# Patient Record
Sex: Male | Born: 1980 | Race: White | Hispanic: No | Marital: Married | Smoking: Current every day smoker
Health system: Southern US, Community
[De-identification: ages and names within clinical notes are randomized; demographics above are authoritative.]

---

## 2004-10-31 ENCOUNTER — Emergency Department: Payer: Self-pay | Admitting: Emergency Medicine

## 2005-07-04 ENCOUNTER — Emergency Department: Payer: Self-pay | Admitting: Emergency Medicine

## 2005-07-07 ENCOUNTER — Emergency Department: Payer: Self-pay | Admitting: Emergency Medicine

## 2006-03-25 ENCOUNTER — Emergency Department: Payer: Self-pay | Admitting: Emergency Medicine

## 2006-06-09 ENCOUNTER — Emergency Department: Payer: Self-pay | Admitting: Emergency Medicine

## 2010-03-31 ENCOUNTER — Emergency Department: Payer: Self-pay | Admitting: Emergency Medicine

## 2010-04-25 ENCOUNTER — Emergency Department: Payer: Self-pay | Admitting: Emergency Medicine

## 2010-07-04 ENCOUNTER — Emergency Department: Payer: Self-pay | Admitting: Emergency Medicine

## 2010-07-23 ENCOUNTER — Emergency Department: Payer: Self-pay | Admitting: Emergency Medicine

## 2011-04-03 ENCOUNTER — Emergency Department: Payer: Self-pay | Admitting: Emergency Medicine

## 2012-05-08 ENCOUNTER — Emergency Department: Payer: Self-pay | Admitting: Emergency Medicine

## 2012-08-25 ENCOUNTER — Emergency Department: Payer: Self-pay | Admitting: Emergency Medicine

## 2013-08-23 ENCOUNTER — Emergency Department: Payer: Self-pay | Admitting: Emergency Medicine

## 2013-08-23 LAB — COMPREHENSIVE METABOLIC PANEL
Albumin: 3.8 g/dL (ref 3.4–5.0)
Alkaline Phosphatase: 82 U/L
Anion Gap: 2 — ABNORMAL LOW (ref 7–16)
BUN: 7 mg/dL (ref 7–18)
Creatinine: 0.9 mg/dL (ref 0.60–1.30)
EGFR (Non-African Amer.): >60
Potassium: 3.9 mmol/L (ref 3.5–5.1)
SGPT (ALT): 22 U/L (ref 12–78)
Sodium: 139 mmol/L (ref 136–145)
Total Protein: 6.7 g/dL (ref 6.4–8.2)

## 2013-08-23 LAB — CBC WITH DIFFERENTIAL/PLATELET
Basophil #: 0.1 10*3/uL (ref 0.0–0.1)
Basophil %: 0.7 %
Eosinophil #: 0.1 10*3/uL (ref 0.0–0.7)
HCT: 38.9 % — ABNORMAL LOW (ref 40.0–52.0)
HGB: 13.2 g/dL (ref 13.0–18.0)
MCH: 31.8 pg (ref 26.0–34.0)
MCHC: 34 g/dL (ref 32.0–36.0)
Monocyte #: 0.5 x10 3/mm (ref 0.2–1.0)
Neutrophil #: 5.5 10*3/uL (ref 1.4–6.5)
Neutrophil %: 73.5 %
RDW: 12.9 % (ref 11.5–14.5)
WBC: 7.5 10*3/uL (ref 3.8–10.6)

## 2013-08-23 LAB — URINALYSIS, COMPLETE
Blood: NEGATIVE
Glucose,UR: NEGATIVE mg/dL (ref 0–75)
Ketone: NEGATIVE
Leukocyte Esterase: NEGATIVE
Nitrite: NEGATIVE
Ph: 8 (ref 4.5–8.0)
Protein: NEGATIVE
Specific Gravity: 1.006 (ref 1.003–1.030)
Squamous Epithelial: NONE SEEN

## 2013-08-23 LAB — LIPASE, BLOOD: Lipase: 76 U/L (ref 73–393)

## 2014-06-18 ENCOUNTER — Emergency Department: Payer: Self-pay | Admitting: Emergency Medicine

## 2014-06-18 LAB — BASIC METABOLIC PANEL
Anion Gap: 5 — ABNORMAL LOW (ref 7–16)
BUN: 10 mg/dL (ref 7–18)
CALCIUM: 8 mg/dL — AB (ref 8.5–10.1)
CO2: 29 mmol/L (ref 21–32)
Chloride: 106 mmol/L (ref 98–107)
Creatinine: 0.84 mg/dL (ref 0.60–1.30)
EGFR (African American): >60
EGFR (Non-African Amer.): >60
Glucose: 124 mg/dL — ABNORMAL HIGH (ref 65–99)
Osmolality: 280 (ref 275–301)
Potassium: 3.7 mmol/L (ref 3.5–5.1)
Sodium: 140 mmol/L (ref 136–145)

## 2014-06-18 LAB — CBC WITH DIFFERENTIAL/PLATELET
BASOS PCT: 0.6 %
Basophil #: 0.1 10*3/uL (ref 0.0–0.1)
EOS PCT: 1.4 %
Eosinophil #: 0.2 10*3/uL (ref 0.0–0.7)
HCT: 36.9 % — AB (ref 40.0–52.0)
HGB: 12.2 g/dL — AB (ref 13.0–18.0)
Lymphocyte #: 1.4 10*3/uL (ref 1.0–3.6)
Lymphocyte %: 10.9 %
MCH: 30.8 pg (ref 26.0–34.0)
MCHC: 33 g/dL (ref 32.0–36.0)
MCV: 94 fL (ref 80–100)
Monocyte #: 0.9 x10 3/mm (ref 0.2–1.0)
Monocyte %: 7.3 %
NEUTROS PCT: 79.8 %
Neutrophil #: 10.4 10*3/uL — ABNORMAL HIGH (ref 1.4–6.5)
Platelet: 201 10*3/uL (ref 150–440)
RBC: 3.95 10*6/uL — ABNORMAL LOW (ref 4.40–5.90)
RDW: 12.3 % (ref 11.5–14.5)
WBC: 13.1 10*3/uL — ABNORMAL HIGH (ref 3.8–10.6)

## 2014-06-18 LAB — CK: CK, Total: 83 U/L

## 2014-06-18 LAB — PROTIME-INR
INR: 1
Prothrombin Time: 13 secs (ref 11.5–14.7)

## 2014-06-18 LAB — APTT: ACTIVATED PTT: 62.7 s — AB (ref 23.6–35.9)

## 2014-06-20 ENCOUNTER — Emergency Department: Payer: Self-pay | Admitting: Emergency Medicine

## 2014-06-20 LAB — COMPREHENSIVE METABOLIC PANEL
ALT: 24 U/L
Albumin: 3.5 g/dL (ref 3.4–5.0)
Alkaline Phosphatase: 80 U/L
Anion Gap: 6 — ABNORMAL LOW (ref 7–16)
BUN: 13 mg/dL (ref 7–18)
Bilirubin,Total: 0.7 mg/dL (ref 0.2–1.0)
CHLORIDE: 102 mmol/L (ref 98–107)
Calcium, Total: 8.7 mg/dL (ref 8.5–10.1)
Co2: 30 mmol/L (ref 21–32)
Creatinine: 0.87 mg/dL (ref 0.60–1.30)
EGFR (Non-African Amer.): >60
Glucose: 93 mg/dL (ref 65–99)
OSMOLALITY: 275 (ref 275–301)
Potassium: 3.8 mmol/L (ref 3.5–5.1)
SGOT(AST): 13 U/L — ABNORMAL LOW (ref 15–37)
Sodium: 138 mmol/L (ref 136–145)
Total Protein: 7.7 g/dL (ref 6.4–8.2)

## 2014-06-20 LAB — APTT: Activated PTT: 63.7 secs — ABNORMAL HIGH (ref 23.6–35.9)

## 2014-08-10 IMAGING — CR CERVICAL SPINE - COMPLETE 4+ VIEW
1 series · 6 of 6 positions shown · non-contrast
Comparison: none

REASON FOR EXAM: neck pain hx of old injury
COMMENTS:

PROCEDURE:     DXR - DXR CERVICAL SPINE COMPLETE  - August 25, 2012 [DATE]
RESULT:     Comparison: None.

[Series 1: lat · 0.17mm/px · 6 of 6 slices shown]
[im 1/6]
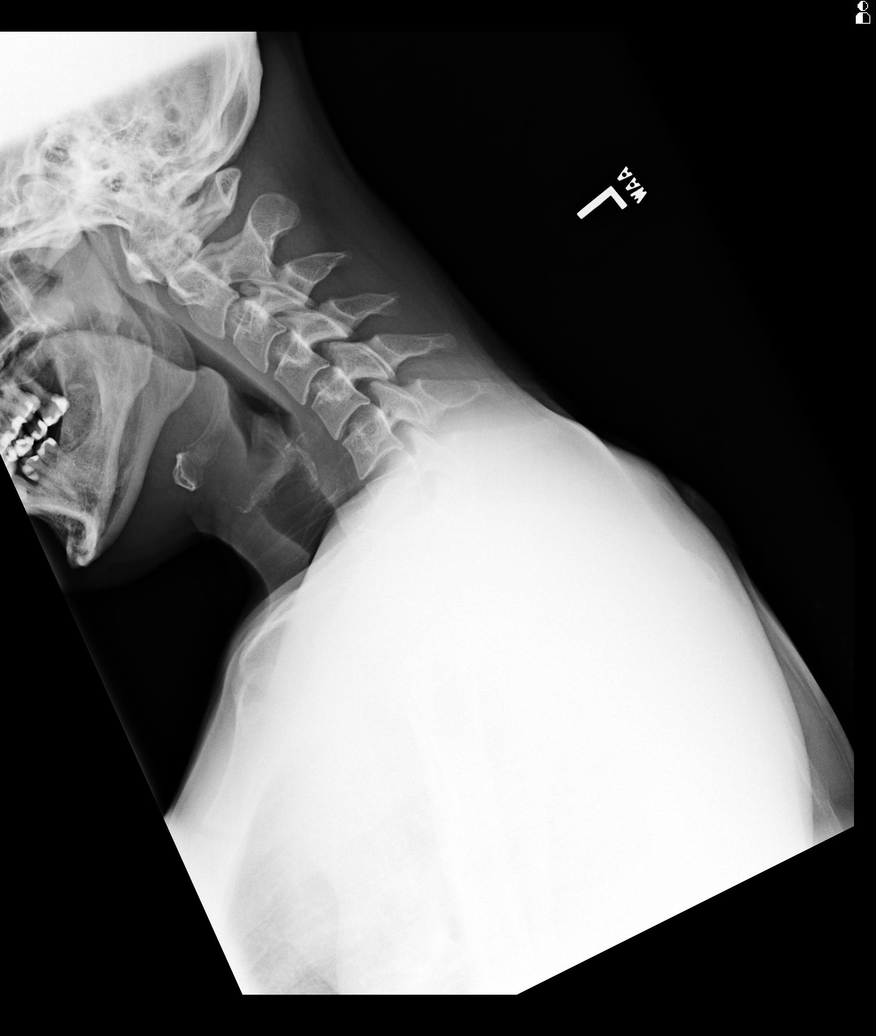
[im 2/6]
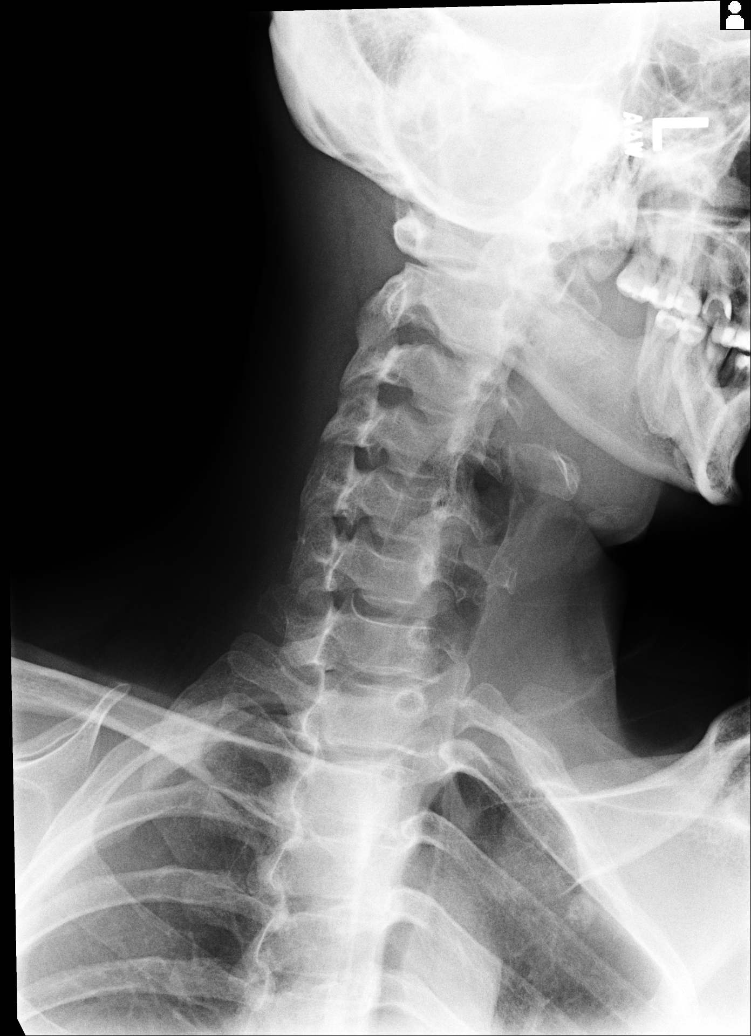
[im 3/6]
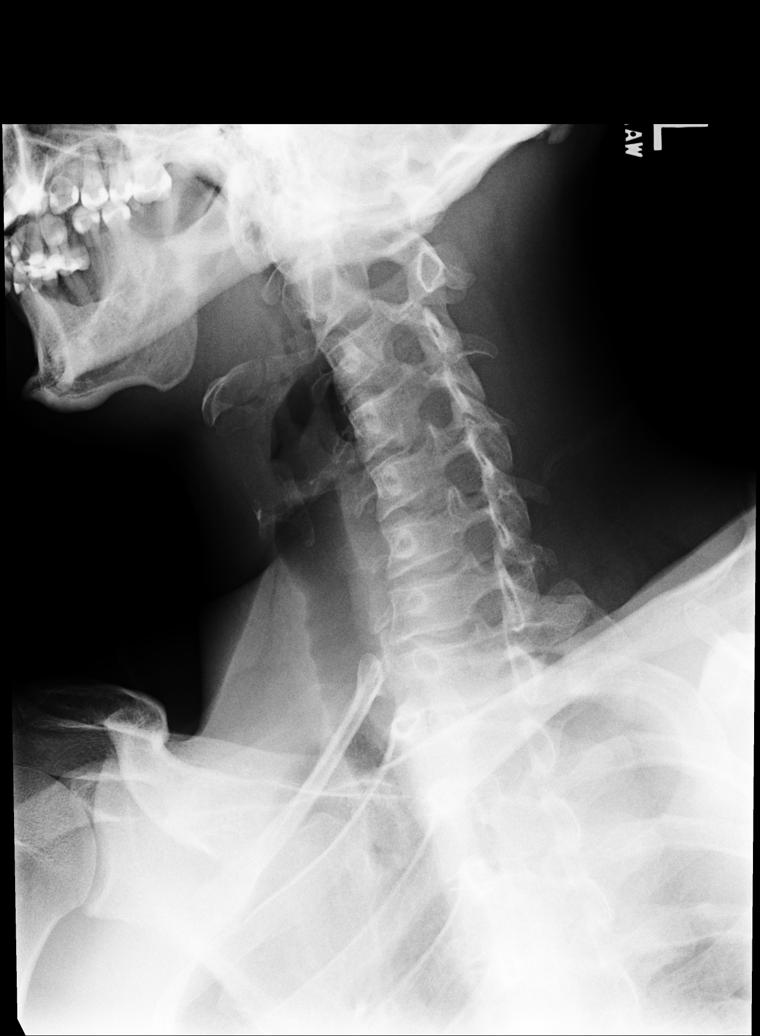
[im 4/6]
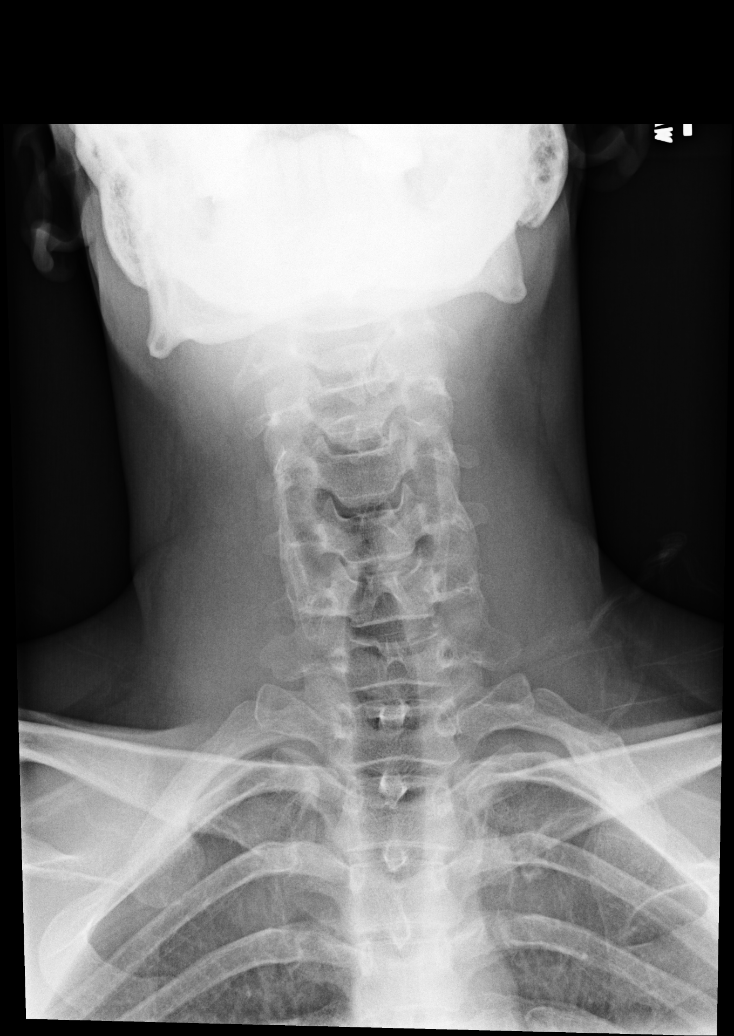
[im 5/6]
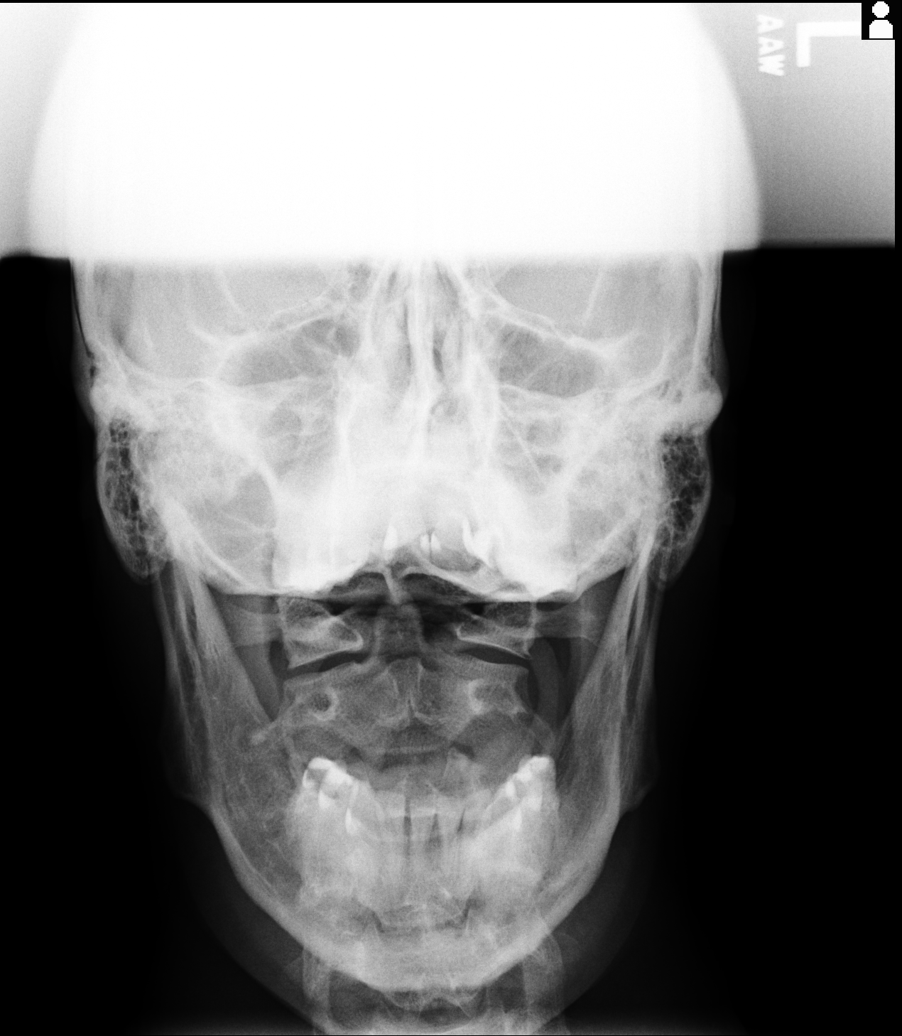
[im 6/6]
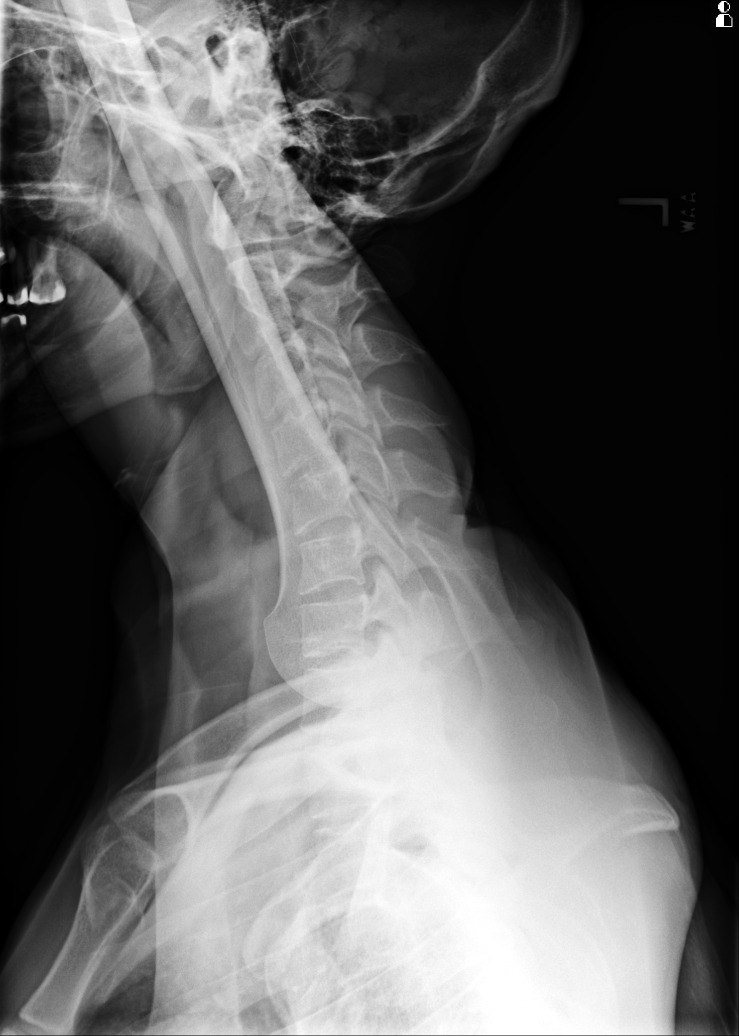

[6 of 6 positions shown; findings below may reference images not displayed]

FINDINGS: The the cervical spine is imaged from the skull base through C7. There is
reversal of the normal cervical lordosis, which is nonspecific. Vertebral
body heights and intervertebral disc heights are relatively preserved. There
is normal alignment. The atlantodental interval is at the upper limits of
normal. The prevertebral soft tissues are within normal limits. Evaluation
of the right neuroforamina is slightly limited secondary to patient
position. The left neuroforamina are patent.
IMPRESSION: Reversal of the normal cervical lordosis, which is nonspecific. Otherwise,
no acute findings the cervical spine.

If there is continued clinical concern for occult cervical spine fracture,
further evaluation with CT would be recommended.

[REDACTED]
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
## 2016-11-14 ENCOUNTER — Emergency Department: Payer: Medicaid Other

## 2016-11-14 ENCOUNTER — Encounter: Payer: Self-pay | Admitting: Emergency Medicine

## 2016-11-14 ENCOUNTER — Emergency Department
Admission: EM | Admit: 2016-11-14 | Discharge: 2016-11-14 | Disposition: A | Discharge status: Home / Self Care | Payer: Medicaid Other | Attending: Emergency Medicine | Admitting: Emergency Medicine

## 2016-11-14 DIAGNOSIS — R05 Cough: Secondary | ICD-10-CM | POA: Diagnosis present

## 2016-11-14 DIAGNOSIS — J189 Pneumonia, unspecified organism: Secondary | ICD-10-CM

## 2016-11-14 DIAGNOSIS — F17200 Nicotine dependence, unspecified, uncomplicated: Secondary | ICD-10-CM | POA: Insufficient documentation

## 2016-11-14 MED ORDER — AZITHROMYCIN 250 MG PO TABS: ORAL_TABLET | ORAL | 0 refills | Status: AC

## 2016-11-14 MED ORDER — PREDNISONE 50 MG PO TABS: 50.0000 mg | ORAL_TABLET | Freq: Every day | ORAL | 0 refills | Status: DC

## 2016-11-14 MED ORDER — BENZONATATE 200 MG PO CAPS
200.0000 mg | ORAL_CAPSULE | Freq: Three times a day (TID) | ORAL | 0 refills | Status: AC | PRN
Start: 2016-11-14 — End: ?

## 2016-11-14 NOTE — ED Provider Notes (Signed)
[facility] Emergency Department Provider Note  ____________________________________________  Time seen: Approximately 9:08 PM  I have reviewed the triage vital signs and the nursing notes.   HISTORY  Chief Complaint Cough and Generalized Body Aches    HPI Nathaniel Williamson is a 36 y.o. male who presents to emergency department complaining of 4-5 week history of cough, occasional chills, occasional body aches. The patient reports that he has not felt "bad" but just had a lingering cough over the past several weeks. Patient reports that symptoms began like a common cold and the cough has lingered. He denies any night sweats, fevers, difficulty breathing, productive cough, abdominal pain, nausea or vomiting. No medications prior to arrival. Patient is a smoker. No other complaints at this time.   History reviewed. No pertinent past medical history.  There are no active problems to display for this patient.   History reviewed. No pertinent surgical history.  Prior to Admission medications   Medication Sig Start Date End Date Taking? Authorizing Provider  azithromycin (ZITHROMAX Z-PAK) 250 MG tablet Take 2 tablets (500 mg) on  Day 1,  followed by 1 tablet (250 mg) once daily on Days 2 through 5. 11/14/16   Christiane Ha D Zanaya Baize, PA-C  benzonatate (TESSALON) 200 MG capsule Take 1 capsule (200 mg total) by mouth 3 (three) times daily as needed for cough. 11/14/16   Delorise Royals Shraga Custard, PA-C  predniSONE (DELTASONE) 50 MG tablet Take 1 tablet (50 mg total) by mouth daily with breakfast. 11/14/16   Delorise Royals Channie Bostick, PA-C    Allergies Patient has no known allergies.  History reviewed. No pertinent family history.  Social History Social History  Substance Use Topics  . Smoking status: Current Every Day Smoker  . Smokeless tobacco: Current User  . Alcohol use No     Review of Systems  Constitutional: No fever/chills Eyes: No visual changes. No  discharge ENT: No upper respiratory complaints. Cardiovascular: no chest pain. Respiratory: Positive cough. No SOB. Gastrointestinal: No abdominal pain.  No nausea, no vomiting.  No diarrhea.  No constipation. Musculoskeletal: Negative for musculoskeletal pain. Skin: Negative for rash, abrasions, lacerations, ecchymosis. Neurological: Negative for headaches, focal weakness or numbness. 10-point ROS otherwise negative.  ____________________________________________   PHYSICAL EXAM:  VITAL SIGNS: ED Triage Vitals  Enc Vitals Group     BP 11/14/16 2002 115/69     Pulse Rate 11/14/16 2002 79     Resp 11/14/16 2002 18     Temp 11/14/16 2002 98.4 F (36.9 C)     Temp Source 11/14/16 2002 Oral     SpO2 11/14/16 2002 97 %     Weight 11/14/16 2003 185 lb (83.9 kg)     Height 11/14/16 2003 6' (1.829 m)     Head Circumference --      Peak Flow --      Pain Score 11/14/16 2003 6     Pain Loc --      Pain Edu? --      Excl. in GC? --      Constitutional: Alert and oriented. Well appearing and in no acute distress. Eyes: Conjunctivae are normal. PERRL. EOMI. Head: Atraumatic. ENT:      Ears: EACs and TMs are unremarkable bilaterally      Nose: No congestion/rhinnorhea.      Mouth/Throat: Mucous membranes are moist.  Neck: No stridor.   Hematological/Lymphatic/Immunilogical: No cervical lymphadenopathy. Cardiovascular: Normal rate, regular rhythm. Normal S1 and S2.  Good peripheral circulation. Respiratory:  Normal respiratory effort without tachypnea or retractions. Lungs with scattered coarse breath sounds. No definitive rales, rhonchi, wheezing.Peri [place]. Good air entry to the bases with no decreased or absent breath sounds. Musculoskeletal: Full range of motion to all extremities. No gross deformities appreciated. Neurologic:  Normal speech and language. No gross focal neurologic deficits are appreciated.  Skin:  Skin is warm, dry and intact. No rash noted. Psychiatric: Mood and affect  are normal. Speech and behavior are normal. Patient exhibits appropriate insight and judgement.   ____________________________________________   LABS (all labs ordered are listed, but only abnormal results are displayed)  Labs Reviewed - No data to display ____________________________________________  EKG   ____________________________________________  RADIOLOGY Festus BarrenI, Coreen Shippee D Navneet Schmuck, personally viewed and evaluated these images (plain radiographs) as part of my medical decision making, as well as reviewing the written report by the radiologist.  Dg Chest 2 View  Result Date: 11/14/2016 CLINICAL DATA:  36 year old male with cough. EXAM: CHEST  2 VIEW COMPARISON:  None. FINDINGS: The heart size and mediastinal contours are within normal limits. Both lungs are clear. The visualized skeletal structures are unremarkable. IMPRESSION: No active cardiopulmonary disease. Electronically Signed   By: Elgie CollardArash  Radparvar M.D.   On: 11/14/2016 21:01    ____________________________________________    PROCEDURES  Procedure(s) performed:    Procedures    Medications - No data to display   ____________________________________________   INITIAL IMPRESSION / ASSESSMENT AND PLAN / ED COURSE  Pertinent labs & imaging results that were available during my care of the patient were reviewed by me and considered in my medical decision making (see chart for details).  Review of the [place] CSRS was performed in accordance of the [facility] prior to dispensing any controlled drugs.     Patient's diagnosis is consistent with community acquired pneumonia. Patient has had coughing 4-5 weeks. Patient's symptoms are consistent with walking pneumonia. X-ray reveals no acute consolidation consistent with lobar pneumonia.. Patient will be discharged home with prescriptions for steroids and antibiotic. Patient is to follow up with private care as needed or otherwise directed. Patient is given ED precautions to  return to the ED for any worsening or new symptoms.     ____________________________________________  FINAL CLINICAL IMPRESSION(S) / ED DIAGNOSES  Final diagnoses:  Walking pneumonia      NEW MEDICATIONS STARTED DURING THIS VISIT:  New Prescriptions   AZITHROMYCIN (ZITHROMAX Z-PAK) 250 MG TABLET    Take 2 tablets (500 mg) on  Day 1,  followed by 1 tablet (250 mg) once daily on Days 2 through 5.   BENZONATATE (TESSALON) 200 MG CAPSULE    Take 1 capsule (200 mg total) by mouth 3 (three) times daily as needed for cough.   PREDNISONE (DELTASONE) 50 MG TABLET    Take 1 tablet (50 mg total) by mouth daily with breakfast.        This chart was dictated using voice recognition software/Dragon. Despite best efforts to proofread, errors can occur which can change the meaning. Any change was purely unintentional.    Racheal PatchesJonathan D Smriti Barkow, PA-C 11/14/16 2141    Jene Everyobert Kinner, MD 11/14/16 2311

## 2016-11-14 NOTE — ED Triage Notes (Signed)
Pt ambulatory to triage in NAD, reports cold sx including cough, chills, body aches x few days.

## 2017-06-25 ENCOUNTER — Emergency Department
Admission: EM | Admit: 2017-06-25 | Discharge: 2017-06-25 | Disposition: A | Discharge status: Home / Self Care | Payer: Medicaid Other | Attending: Emergency Medicine | Admitting: Emergency Medicine

## 2017-06-25 ENCOUNTER — Encounter: Payer: Self-pay | Admitting: *Deleted

## 2017-06-25 DIAGNOSIS — M25511 Pain in right shoulder: Secondary | ICD-10-CM | POA: Insufficient documentation

## 2017-06-25 DIAGNOSIS — F17200 Nicotine dependence, unspecified, uncomplicated: Secondary | ICD-10-CM | POA: Diagnosis not present

## 2017-06-25 DIAGNOSIS — F17220 Nicotine dependence, chewing tobacco, uncomplicated: Secondary | ICD-10-CM | POA: Insufficient documentation

## 2017-06-25 DIAGNOSIS — M7541 Impingement syndrome of right shoulder: Secondary | ICD-10-CM | POA: Diagnosis not present

## 2017-06-25 MED ORDER — DEXAMETHASONE SODIUM PHOSPHATE 10 MG/ML IJ SOLN
10.0000 mg | Freq: Once | INTRAMUSCULAR | Status: AC
  Administered 2017-06-25: 10 mg via INTRAMUSCULAR
  Filled 2017-06-25: qty 1

## 2017-06-25 MED ORDER — PREDNISONE 10 MG (21) PO TBPK: ORAL_TABLET | ORAL | 0 refills | Status: AC
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
## 2017-06-25 NOTE — Discharge Instructions (Signed)
Take medication as prescribed.   Return to emergency department if symptoms worsen and follow-up with PCP as needed.    Call to schedule an appointment with Orthopedics.
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
## 2018-05-20 ENCOUNTER — Emergency Department
Admission: EM | Admit: 2018-05-20 | Discharge: 2018-05-20 | Disposition: A | Discharge status: Home / Self Care | Attending: Emergency Medicine | Admitting: Emergency Medicine

## 2018-05-20 ENCOUNTER — Encounter: Payer: Self-pay | Admitting: Emergency Medicine

## 2018-05-20 DIAGNOSIS — Y939 Activity, unspecified: Secondary | ICD-10-CM | POA: Diagnosis not present

## 2018-05-20 DIAGNOSIS — S0181XA Laceration without foreign body of other part of head, initial encounter: Secondary | ICD-10-CM | POA: Insufficient documentation

## 2018-05-20 DIAGNOSIS — Y999 Unspecified external cause status: Secondary | ICD-10-CM | POA: Insufficient documentation

## 2018-05-20 DIAGNOSIS — Y92149 Unspecified place in prison as the place of occurrence of the external cause: Secondary | ICD-10-CM | POA: Insufficient documentation

## 2018-05-20 DIAGNOSIS — S0990XA Unspecified injury of head, initial encounter: Secondary | ICD-10-CM | POA: Diagnosis present

## 2018-05-20 DIAGNOSIS — F17200 Nicotine dependence, unspecified, uncomplicated: Secondary | ICD-10-CM | POA: Diagnosis not present

## 2018-05-20 MED ORDER — LIDOCAINE-EPINEPHRINE-TETRACAINE (LET) SOLUTION
3.0000 mL | Freq: Once | NASAL | Status: AC
  Administered 2018-05-20: 11:00:00 3 mL via TOPICAL
  Filled 2018-05-20: qty 3
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
## 2018-05-20 NOTE — Discharge Instructions (Signed)
[facility] any [facility] problems or [facility].  Keep area clean and dry.  Do not allow the area to get wet.  This will peel off on its own which should take 5 to 7 days.  Do not pick at the area. You may take Tylenol or ibuprofen if needed for pain.
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
## 2018-10-30 IMAGING — CR DG CHEST 2V
1 series · 2 of 2 positions shown · non-contrast
Comparison: None.

CLINICAL DATA: 35-year-old male with cough.

EXAM:
CHEST  2 VIEW

[Series 1: dg chest 2 view · 0.14mm/px · 2 of 2 slices shown]
[im 1/2]
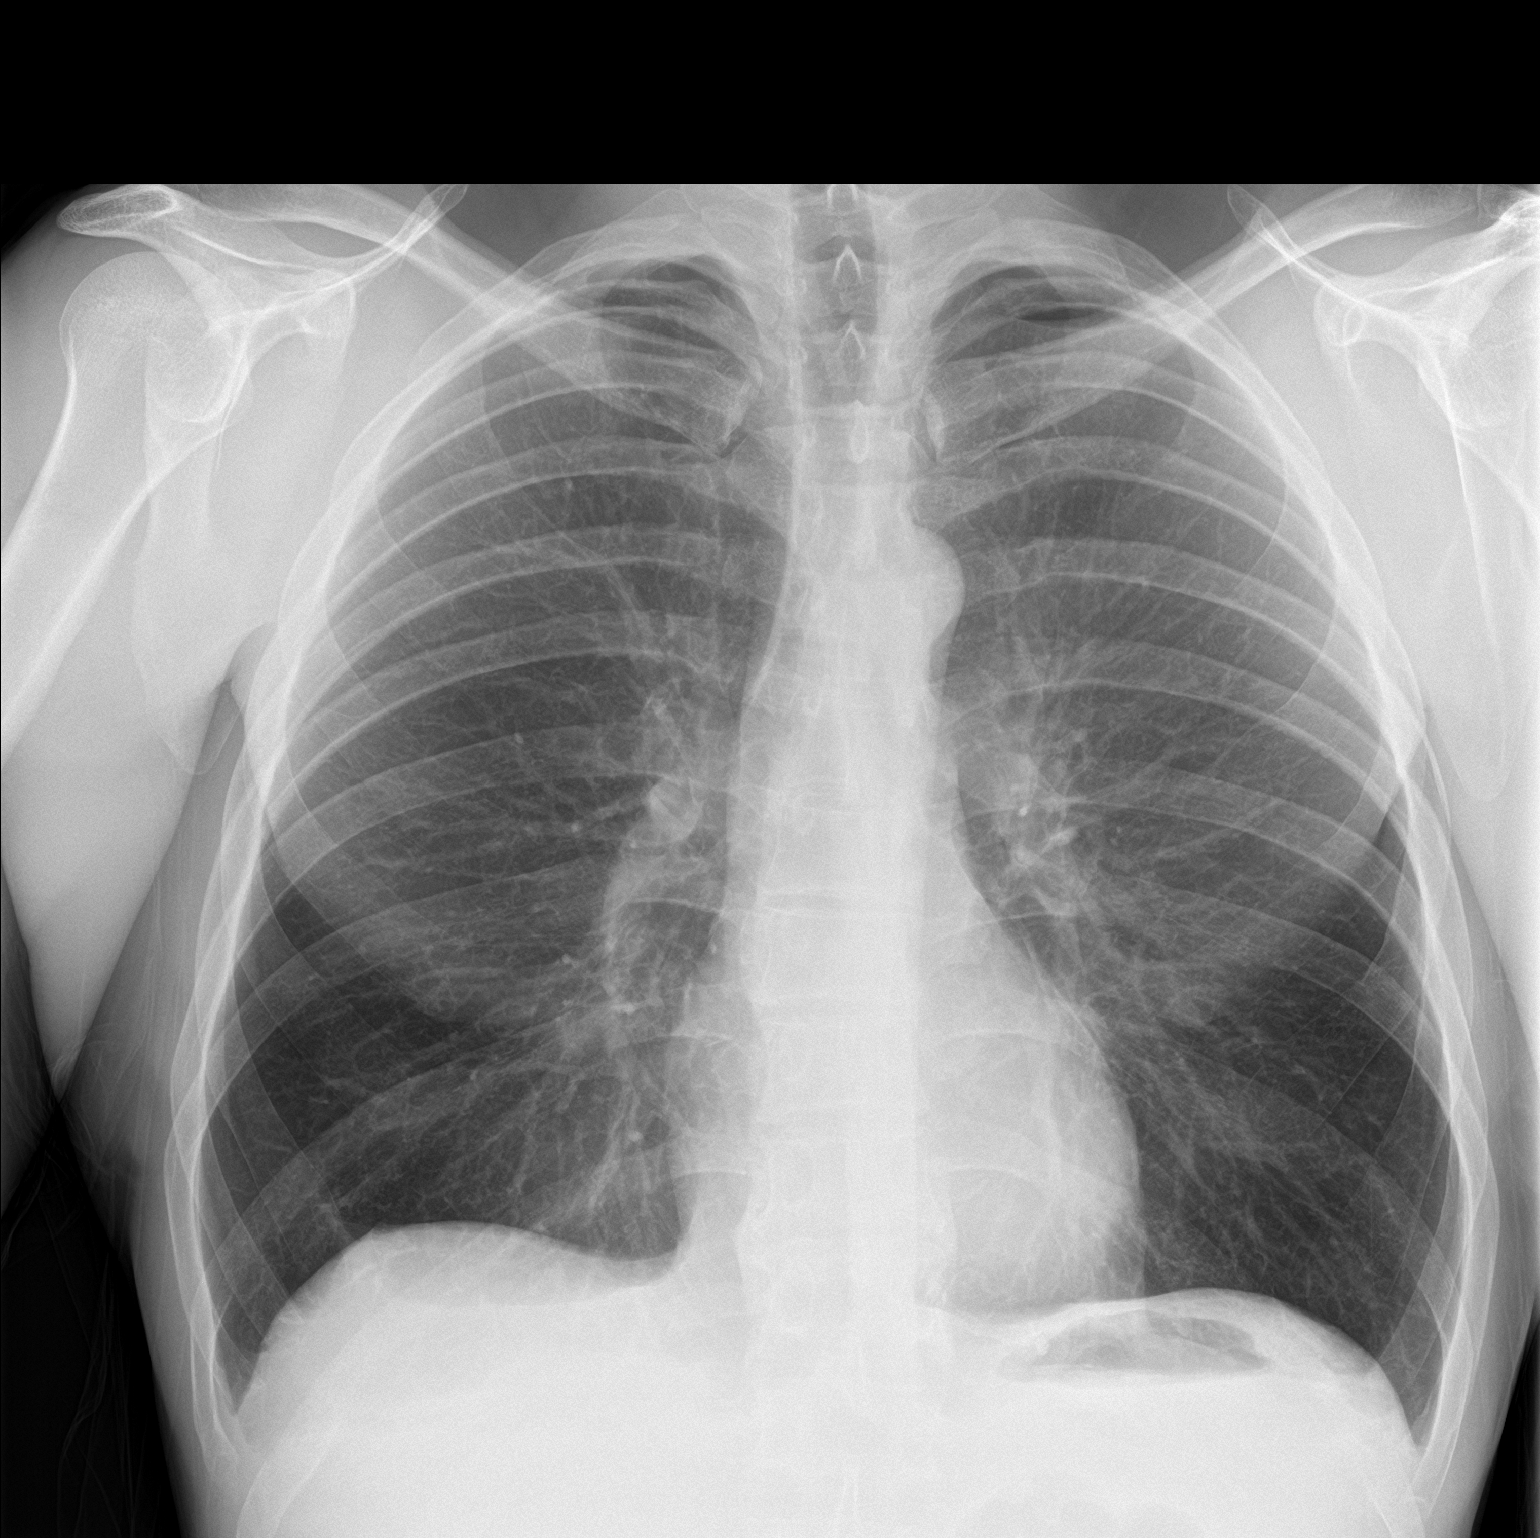
[im 2/2]
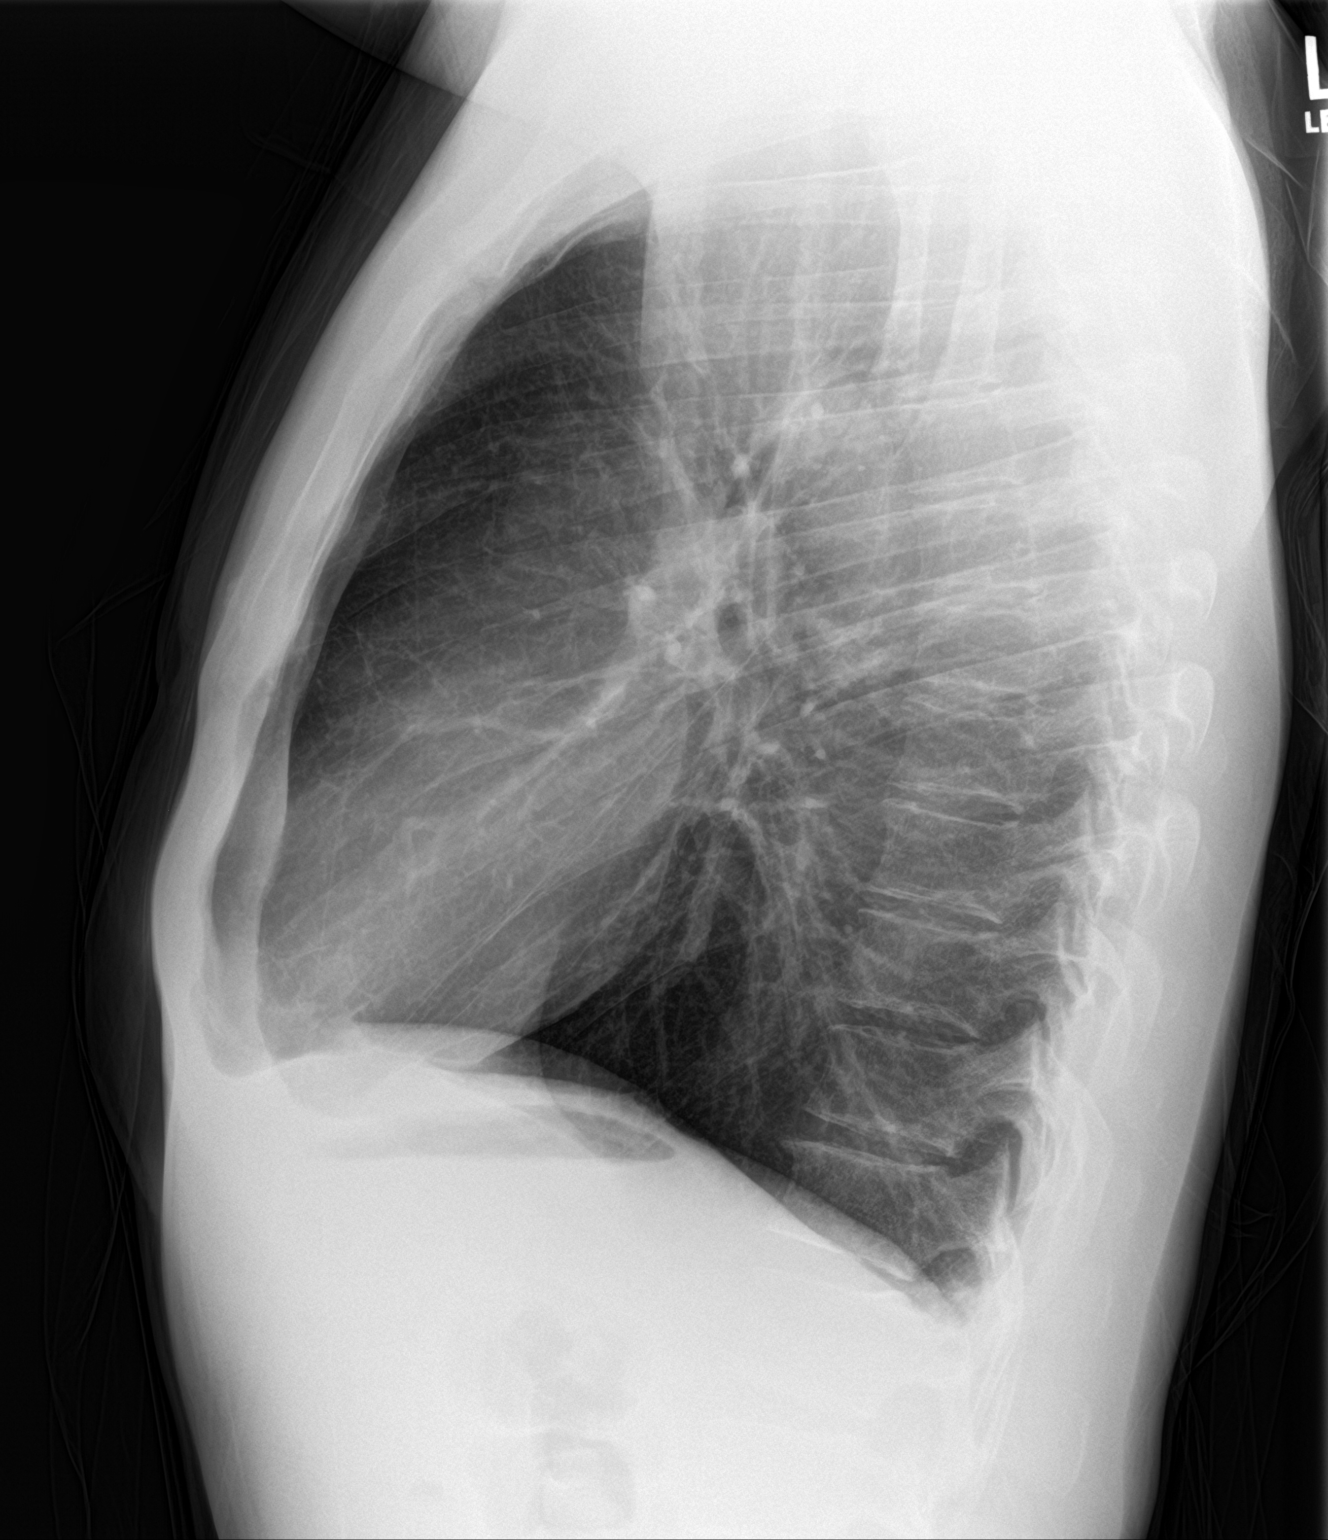

[2 of 2 positions shown; findings below may reference images not displayed]

FINDINGS: The heart size and mediastinal contours are within normal limits.
Both lungs are clear. The visualized skeletal structures are
unremarkable.
IMPRESSION: No active cardiopulmonary disease.
